# Patient Record
Sex: Female | Born: 1965 | Race: White | Hispanic: No | Marital: Married | Smoking: Never smoker
Health system: Southern US, Community
[De-identification: ages and names within clinical notes are randomized; demographics above are authoritative.]

## PROBLEM LIST (undated history)

## (undated) HISTORY — PX: EYE SURGERY: SHX253

---

## 2006-11-24 ENCOUNTER — Other Ambulatory Visit: Admission: RE | Admit: 2006-11-24 | Discharge: 2006-11-24 | Payer: Self-pay | Admitting: Family Medicine

## 2016-05-31 DIAGNOSIS — M5431 Sciatica, right side: Secondary | ICD-10-CM | POA: Diagnosis not present

## 2016-05-31 DIAGNOSIS — M9903 Segmental and somatic dysfunction of lumbar region: Secondary | ICD-10-CM | POA: Diagnosis not present

## 2016-06-02 DIAGNOSIS — M9903 Segmental and somatic dysfunction of lumbar region: Secondary | ICD-10-CM | POA: Diagnosis not present

## 2016-06-02 DIAGNOSIS — M5431 Sciatica, right side: Secondary | ICD-10-CM | POA: Diagnosis not present

## 2016-06-08 DIAGNOSIS — M5431 Sciatica, right side: Secondary | ICD-10-CM | POA: Diagnosis not present

## 2016-06-08 DIAGNOSIS — M9903 Segmental and somatic dysfunction of lumbar region: Secondary | ICD-10-CM | POA: Diagnosis not present

## 2016-06-09 DIAGNOSIS — M5431 Sciatica, right side: Secondary | ICD-10-CM | POA: Diagnosis not present

## 2016-06-09 DIAGNOSIS — M9903 Segmental and somatic dysfunction of lumbar region: Secondary | ICD-10-CM | POA: Diagnosis not present

## 2016-06-14 DIAGNOSIS — M9903 Segmental and somatic dysfunction of lumbar region: Secondary | ICD-10-CM | POA: Diagnosis not present

## 2016-06-14 DIAGNOSIS — M5431 Sciatica, right side: Secondary | ICD-10-CM | POA: Diagnosis not present

## 2016-06-16 DIAGNOSIS — M9903 Segmental and somatic dysfunction of lumbar region: Secondary | ICD-10-CM | POA: Diagnosis not present

## 2016-06-16 DIAGNOSIS — M5431 Sciatica, right side: Secondary | ICD-10-CM | POA: Diagnosis not present

## 2016-06-21 DIAGNOSIS — M9903 Segmental and somatic dysfunction of lumbar region: Secondary | ICD-10-CM | POA: Diagnosis not present

## 2016-06-21 DIAGNOSIS — M5431 Sciatica, right side: Secondary | ICD-10-CM | POA: Diagnosis not present

## 2016-06-23 DIAGNOSIS — M9903 Segmental and somatic dysfunction of lumbar region: Secondary | ICD-10-CM | POA: Diagnosis not present

## 2016-06-23 DIAGNOSIS — M5431 Sciatica, right side: Secondary | ICD-10-CM | POA: Diagnosis not present

## 2016-06-28 DIAGNOSIS — M9903 Segmental and somatic dysfunction of lumbar region: Secondary | ICD-10-CM | POA: Diagnosis not present

## 2016-06-28 DIAGNOSIS — M5431 Sciatica, right side: Secondary | ICD-10-CM | POA: Diagnosis not present

## 2016-06-30 DIAGNOSIS — M9903 Segmental and somatic dysfunction of lumbar region: Secondary | ICD-10-CM | POA: Diagnosis not present

## 2016-06-30 DIAGNOSIS — M5431 Sciatica, right side: Secondary | ICD-10-CM | POA: Diagnosis not present

## 2016-07-06 DIAGNOSIS — M5431 Sciatica, right side: Secondary | ICD-10-CM | POA: Diagnosis not present

## 2016-07-06 DIAGNOSIS — M9903 Segmental and somatic dysfunction of lumbar region: Secondary | ICD-10-CM | POA: Diagnosis not present

## 2016-07-12 DIAGNOSIS — M9903 Segmental and somatic dysfunction of lumbar region: Secondary | ICD-10-CM | POA: Diagnosis not present

## 2016-07-12 DIAGNOSIS — M5431 Sciatica, right side: Secondary | ICD-10-CM | POA: Diagnosis not present

## 2016-07-26 DIAGNOSIS — M5431 Sciatica, right side: Secondary | ICD-10-CM | POA: Diagnosis not present

## 2016-07-26 DIAGNOSIS — M9903 Segmental and somatic dysfunction of lumbar region: Secondary | ICD-10-CM | POA: Diagnosis not present

## 2016-08-09 DIAGNOSIS — M5431 Sciatica, right side: Secondary | ICD-10-CM | POA: Diagnosis not present

## 2016-08-09 DIAGNOSIS — M9903 Segmental and somatic dysfunction of lumbar region: Secondary | ICD-10-CM | POA: Diagnosis not present

## 2016-09-13 DIAGNOSIS — M9903 Segmental and somatic dysfunction of lumbar region: Secondary | ICD-10-CM | POA: Diagnosis not present

## 2016-09-13 DIAGNOSIS — M5431 Sciatica, right side: Secondary | ICD-10-CM | POA: Diagnosis not present

## 2016-09-26 DIAGNOSIS — M5431 Sciatica, right side: Secondary | ICD-10-CM | POA: Diagnosis not present

## 2016-09-26 DIAGNOSIS — M9903 Segmental and somatic dysfunction of lumbar region: Secondary | ICD-10-CM | POA: Diagnosis not present

## 2016-10-18 DIAGNOSIS — M5431 Sciatica, right side: Secondary | ICD-10-CM | POA: Diagnosis not present

## 2016-10-18 DIAGNOSIS — M9903 Segmental and somatic dysfunction of lumbar region: Secondary | ICD-10-CM | POA: Diagnosis not present

## 2016-10-28 DIAGNOSIS — Z13220 Encounter for screening for lipoid disorders: Secondary | ICD-10-CM | POA: Diagnosis not present

## 2016-10-28 DIAGNOSIS — Z124 Encounter for screening for malignant neoplasm of cervix: Secondary | ICD-10-CM | POA: Diagnosis not present

## 2016-10-28 DIAGNOSIS — Z131 Encounter for screening for diabetes mellitus: Secondary | ICD-10-CM | POA: Diagnosis not present

## 2016-10-28 DIAGNOSIS — Z0000 Encounter for general adult medical examination without abnormal findings: Secondary | ICD-10-CM | POA: Diagnosis not present

## 2016-11-22 DIAGNOSIS — M9903 Segmental and somatic dysfunction of lumbar region: Secondary | ICD-10-CM | POA: Diagnosis not present

## 2016-11-22 DIAGNOSIS — M5431 Sciatica, right side: Secondary | ICD-10-CM | POA: Diagnosis not present

## 2016-12-27 DIAGNOSIS — M5431 Sciatica, right side: Secondary | ICD-10-CM | POA: Diagnosis not present

## 2016-12-27 DIAGNOSIS — M9903 Segmental and somatic dysfunction of lumbar region: Secondary | ICD-10-CM | POA: Diagnosis not present

## 2017-01-24 DIAGNOSIS — M9903 Segmental and somatic dysfunction of lumbar region: Secondary | ICD-10-CM | POA: Diagnosis not present

## 2017-01-24 DIAGNOSIS — M5431 Sciatica, right side: Secondary | ICD-10-CM | POA: Diagnosis not present

## 2017-04-09 ENCOUNTER — Emergency Department (HOSPITAL_COMMUNITY): Payer: Federal, State, Local not specified - PPO

## 2017-04-09 ENCOUNTER — Encounter (HOSPITAL_COMMUNITY): Payer: Self-pay | Admitting: Emergency Medicine

## 2017-04-09 ENCOUNTER — Emergency Department (HOSPITAL_COMMUNITY)
Admission: EM | Admit: 2017-04-09 | Discharge: 2017-04-09 | Disposition: A | Discharge status: Home / Self Care | Payer: Federal, State, Local not specified - PPO | Attending: Emergency Medicine | Admitting: Emergency Medicine

## 2017-04-09 DIAGNOSIS — Z23 Encounter for immunization: Secondary | ICD-10-CM | POA: Insufficient documentation

## 2017-04-09 DIAGNOSIS — W231XXA Caught, crushed, jammed, or pinched between stationary objects, initial encounter: Secondary | ICD-10-CM | POA: Diagnosis not present

## 2017-04-09 DIAGNOSIS — Y999 Unspecified external cause status: Secondary | ICD-10-CM | POA: Diagnosis not present

## 2017-04-09 DIAGNOSIS — S61422A Laceration with foreign body of left hand, initial encounter: Secondary | ICD-10-CM | POA: Insufficient documentation

## 2017-04-09 DIAGNOSIS — Y929 Unspecified place or not applicable: Secondary | ICD-10-CM | POA: Diagnosis not present

## 2017-04-09 DIAGNOSIS — Y93G1 Activity, food preparation and clean up: Secondary | ICD-10-CM | POA: Diagnosis not present

## 2017-04-09 DIAGNOSIS — S60922A Unspecified superficial injury of left hand, initial encounter: Secondary | ICD-10-CM | POA: Diagnosis present

## 2017-04-09 MED ORDER — CEPHALEXIN 500 MG PO CAPS: 500.0000 mg | ORAL_CAPSULE | Freq: Four times a day (QID) | ORAL | 0 refills | Status: AC

## 2017-04-09 MED ORDER — BACITRACIN ZINC 500 UNIT/GM EX OINT
TOPICAL_OINTMENT | Freq: Two times a day (BID) | CUTANEOUS | Status: DC
  Administered 2017-04-09: 1 via TOPICAL
  Filled 2017-04-09: qty 0.9

## 2017-04-09 MED ORDER — LIDOCAINE HCL (PF) 1 % IJ SOLN
10.0000 mL | Freq: Once | INTRAMUSCULAR | Status: AC
  Administered 2017-04-09: 10 mL
  Filled 2017-04-09: qty 10

## 2017-04-09 MED ORDER — TETANUS-DIPHTH-ACELL PERTUSSIS 5-2.5-18.5 LF-MCG/0.5 IM SUSP
0.5000 mL | Freq: Once | INTRAMUSCULAR | Status: AC
  Administered 2017-04-09: 0.5 mL via INTRAMUSCULAR
  Filled 2017-04-09: qty 0.5

## 2017-04-09 MED ORDER — CEPHALEXIN 250 MG PO CAPS
500.0000 mg | ORAL_CAPSULE | Freq: Once | ORAL | Status: AC
  Administered 2017-04-09: 500 mg via ORAL
  Filled 2017-04-09: qty 2

## 2017-04-09 NOTE — Discharge Instructions (Signed)
Follow up with your doctor in 7 to 10 days for suture removal. Return here sooner for any signs of infection.

## 2017-04-09 NOTE — ED Provider Notes (Signed)
[facility]EMERGENCY DEPT Provider Note    By signing my name below, I, Christina Rosario, attest that this documentation has been prepared under the direction and in the presence of [facility], [place]. Electronically Signed: Earmon Rosario, ED Scribe. 04/09/17. 10:50 PM.    History   Chief Complaint Chief Complaint  Patient presents with  . Laceration   The history is provided by the patient and medical records. No language interpreter was used.    Christina Rosario is a 51 y.o. female who presents to the Emergency Department complaining of a laceration to the left hand that occurred approximately 1.5 hours ago. She reports associated bleeding that has been uncontrolled at this time. She states she hit her head on the kennel. She states she was feeding her son's dog when he ran off causing the leash to lacerate the dorsum of the hand. She has not taken anything for pain. There are no modifying factors noted. She denies fever, chills, nausea, vomiting, numbness, tingling or weakness of the left hand or fingers, neck pain, LOC. She states her tetanus vaccination was between 5-10 years ago but she is unsure.  History reviewed. No pertinent past medical history.  There are no active problems to display for this patient.   Past Surgical History:  Procedure Laterality Date  . EYE SURGERY      OB History    No data available       Home Medications    Prior to Admission medications   Medication Sig Start Date End Date Taking? Authorizing Provider  cephALEXin (KEFLEX) 500 MG capsule Take 1 capsule (500 mg total) by mouth 4 (four) times daily. 04/09/17   Janne Napoleon, NP    Family History No family history on file.  Social History Social History  Substance Use Topics  . Smoking status: Never Smoker  . Smokeless tobacco: Never Used  . Alcohol use Yes     Comment: occ     Allergies   Patient has no known allergies.   Review of Systems Review of Systems  Constitutional:  Negative for chills and fever.  Gastrointestinal: Negative for nausea and vomiting.  Skin: Positive for wound.  Neurological: Negative for syncope, weakness and numbness.     Physical Exam Updated Vital Signs BP 119/89 (BP Location: Right Arm)   Pulse 97   Temp 98.1 F (36.7 C) (Oral)   Resp 16   Ht 5' 4.5" (1.638 m)   Wt 136 lb 2 oz (61.7 kg)   SpO2 99%   BMI 23.00 kg/m   Physical Exam  Constitutional: She is oriented to person, place, and time. She appears well-developed and well-nourished. No distress.  HENT:  Head: Normocephalic and atraumatic.  Eyes: EOM are normal.  Neck: Neck supple.  Cardiovascular: Normal rate.   Pulmonary/Chest: Effort normal.  Musculoskeletal: Normal range of motion. She exhibits tenderness. She exhibits no deformity.  6.5 cm laceration to the dorsum of the left hand at base of fingers. Full ROM of left hand. Full flexion and extension of fingers without difficulty.  Neurological: She is alert and oriented to person, place, and time. No cranial nerve deficit.  Skin: Skin is warm and dry.  Psychiatric: She has a normal mood and affect.  Nursing note and vitals reviewed.    ED Treatments / Results  DIAGNOSTIC STUDIES: Oxygen Saturation is 99% on RA, normal by my interpretation.   COORDINATION OF CARE: 9:19 PM- Will order imaging and tetanus vaccination. Offered pain medication but patient  declined. Will suture wound after imaging results. Pt verbalizes understanding and agrees to plan.  Medications  bacitracin ointment (1 application Topical Given 04/09/17 2311)  Tdap (BOOSTRIX) injection 0.5 mL (0.5 mLs Intramuscular Given 04/09/17 2312)  lidocaine (PF) (XYLOCAINE) 1 % injection 10 mL (10 mLs Infiltration Given 04/09/17 2130)  cephALEXin (KEFLEX) capsule 500 mg (500 mg Oral Given 04/09/17 2311)    Labs (all labs ordered are listed, but only abnormal results are displayed) Labs Reviewed - No data to display  EKG  EKG Interpretation None         Radiology Dg Hand Complete Left  Result Date: 04/09/2017 CLINICAL DATA:  Laceration from dog leash, initial encounter EXAM: LEFT HAND - COMPLETE 3+ VIEW COMPARISON:  None. FINDINGS: Soft tissue defect is noted posteriorly consistent with the given clinical history. No acute bony abnormality is seen. IMPRESSION: Soft tissue injury without bony abnormality. Electronically Signed   By: Alcide CleverMark  Lukens M.D.   On: 04/09/2017 22:05    Procedures .Marland Kitchen.Laceration Repair Date/Time: 04/09/2017 10:12 PM Performed by: Janne NapoleonNEESE, Georganne Siple M Authorized by: Janne NapoleonNEESE, Khara Renaud M   Consent:    Consent obtained:  Verbal   Consent given by:  Patient   Risks discussed:  Infection, pain and poor cosmetic result Anesthesia (see MAR for exact dosages):    Anesthesia method:  Local infiltration   Local anesthetic:  Lidocaine 1% w/o epi Laceration details:    Location:  Hand   Hand location:  L hand, dorsum   Length (cm):  6.5 Repair type:    Repair type:  Simple Pre-procedure details:    Preparation:  Patient was prepped and draped in usual sterile fashion and imaging obtained to evaluate for foreign bodies Exploration:    Hemostasis achieved with:  Direct pressure   Wound exploration: entire depth of wound probed and visualized     Wound extent: no muscle damage noted, no nerve damage noted, no tendon damage noted and no underlying fracture noted     Contaminated: no   Treatment:    Area cleansed with:  Betadine and saline   Amount of cleaning:  Extensive   Irrigation solution:  Sterile saline   Irrigation method:  Syringe   Visualized foreign bodies/material removed: yes (small pieces of dirt)   Skin repair:    Repair method:  Sutures   Suture size:  5-0   Suture material:  Prolene   Suture technique:  Simple interrupted   Number of sutures:  9 Approximation:    Approximation:  Close   Vermilion border: well-aligned   Post-procedure details:    Dressing:  Sterile dressing and antibiotic ointment   Patient  tolerance of procedure:  Tolerated well, no immediate complications Comments:     Tetanus updated       Medications Ordered in ED Medications  bacitracin ointment (1 application Topical Given 04/09/17 2311)  Tdap (BOOSTRIX) injection 0.5 mL (0.5 mLs Intramuscular Given 04/09/17 2312)  lidocaine (PF) (XYLOCAINE) 1 % injection 10 mL (10 mLs Infiltration Given 04/09/17 2130)  cephALEXin (KEFLEX) capsule 500 mg (500 mg Oral Given 04/09/17 2311)     Initial Impression / Assessment and Plan / ED Course  I have reviewed the triage vital signs and the nursing notes.  Tetanus updated in ED. Laceration occurred approximately 2 hours prior to repair. Discussed laceration care with pt and answered questions. Pt to f-u for suture removal in 7 to 10 days and wound check sooner should there be signs of dehiscence or infection. Pt  is hemodynamically stable with no complaints prior to dc.  Will start Keflex due to the contamination of the wound.   Final Clinical Impressions(s) / ED Diagnoses   Final diagnoses:  Laceration of left hand with foreign body, initial encounter    New Prescriptions Discharge Medication List as of 04/09/2017 10:54 PM    START taking these medications   Details  cephALEXin (KEFLEX) 500 MG capsule Take 1 capsule (500 mg total) by mouth 4 (four) times daily., Starting Sun 04/09/2017, Print       I personally performed the services described in this documentation, which was scribed in my presence. The recorded information has been reviewed and is accurate.     Kerrie Buffalo [place], [place] 04/10/17 0136    Gerhard Munch, MD 04/12/17 1230

## 2017-04-09 NOTE — ED Notes (Signed)
ED Provider at bedside. 

## 2017-04-09 NOTE — ED Notes (Signed)
Pt reports she had the dog runner wrapped around her hand when he pulled away she went forward, fell, and hit her head.

## 2017-04-09 NOTE — ED Triage Notes (Signed)
Pt states while attempting to stop dog from running lead was wrapped around L hand, dog pulled patient causing laceration to dorsal left hand and struck top of head on enclosure, denies LOC. Bleeding to hand controlled.

## 2017-04-14 DIAGNOSIS — S61412A Laceration without foreign body of left hand, initial encounter: Secondary | ICD-10-CM | POA: Diagnosis not present

## 2017-04-18 DIAGNOSIS — S61412D Laceration without foreign body of left hand, subsequent encounter: Secondary | ICD-10-CM | POA: Diagnosis not present

## 2017-06-09 DIAGNOSIS — J029 Acute pharyngitis, unspecified: Secondary | ICD-10-CM | POA: Diagnosis not present

## 2017-06-09 DIAGNOSIS — J069 Acute upper respiratory infection, unspecified: Secondary | ICD-10-CM | POA: Diagnosis not present

## 2017-07-25 DIAGNOSIS — M5431 Sciatica, right side: Secondary | ICD-10-CM | POA: Diagnosis not present

## 2017-07-25 DIAGNOSIS — M9903 Segmental and somatic dysfunction of lumbar region: Secondary | ICD-10-CM | POA: Diagnosis not present

## 2017-11-24 IMAGING — DX DG HAND COMPLETE 3+V*L*
3 series · 3 of 3 positions shown · non-contrast
Comparison: None.

CLINICAL DATA: Laceration from dog leash, initial encounter

EXAM:
LEFT HAND - COMPLETE 3+ VIEW

[x hand pa left]
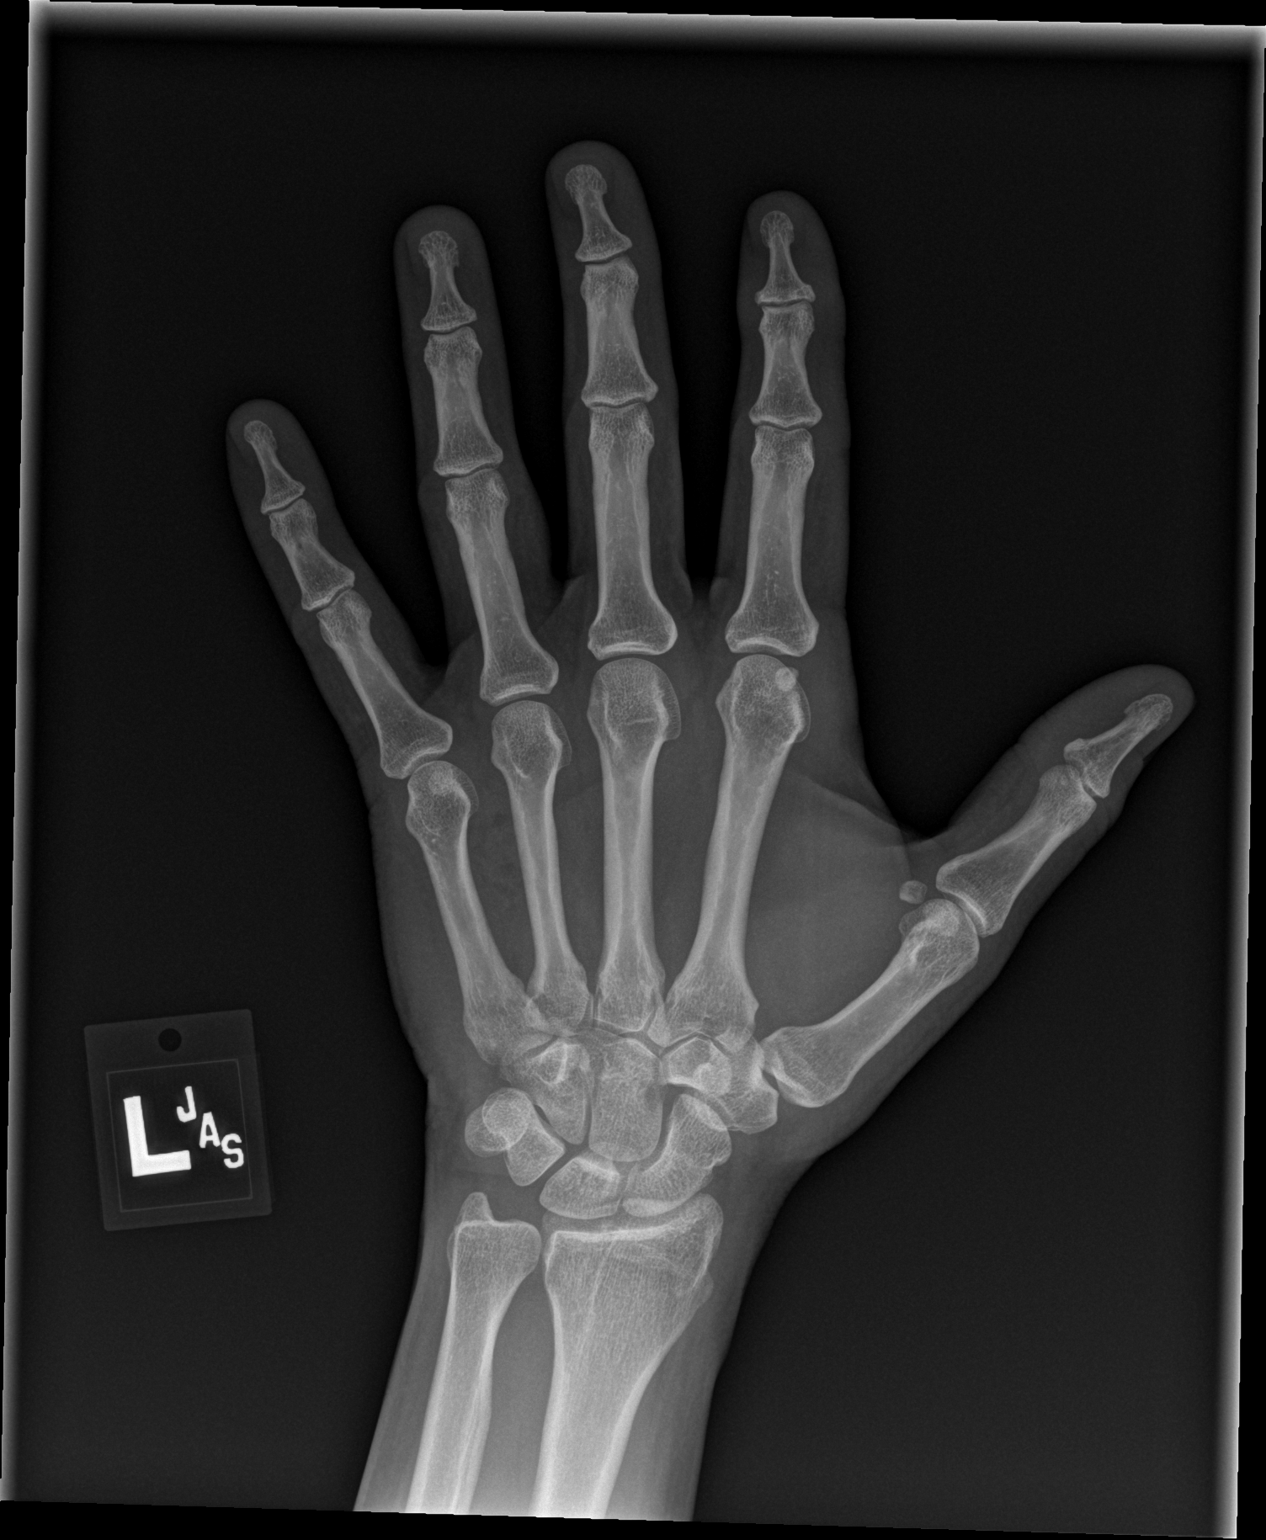

[x hand obl left]
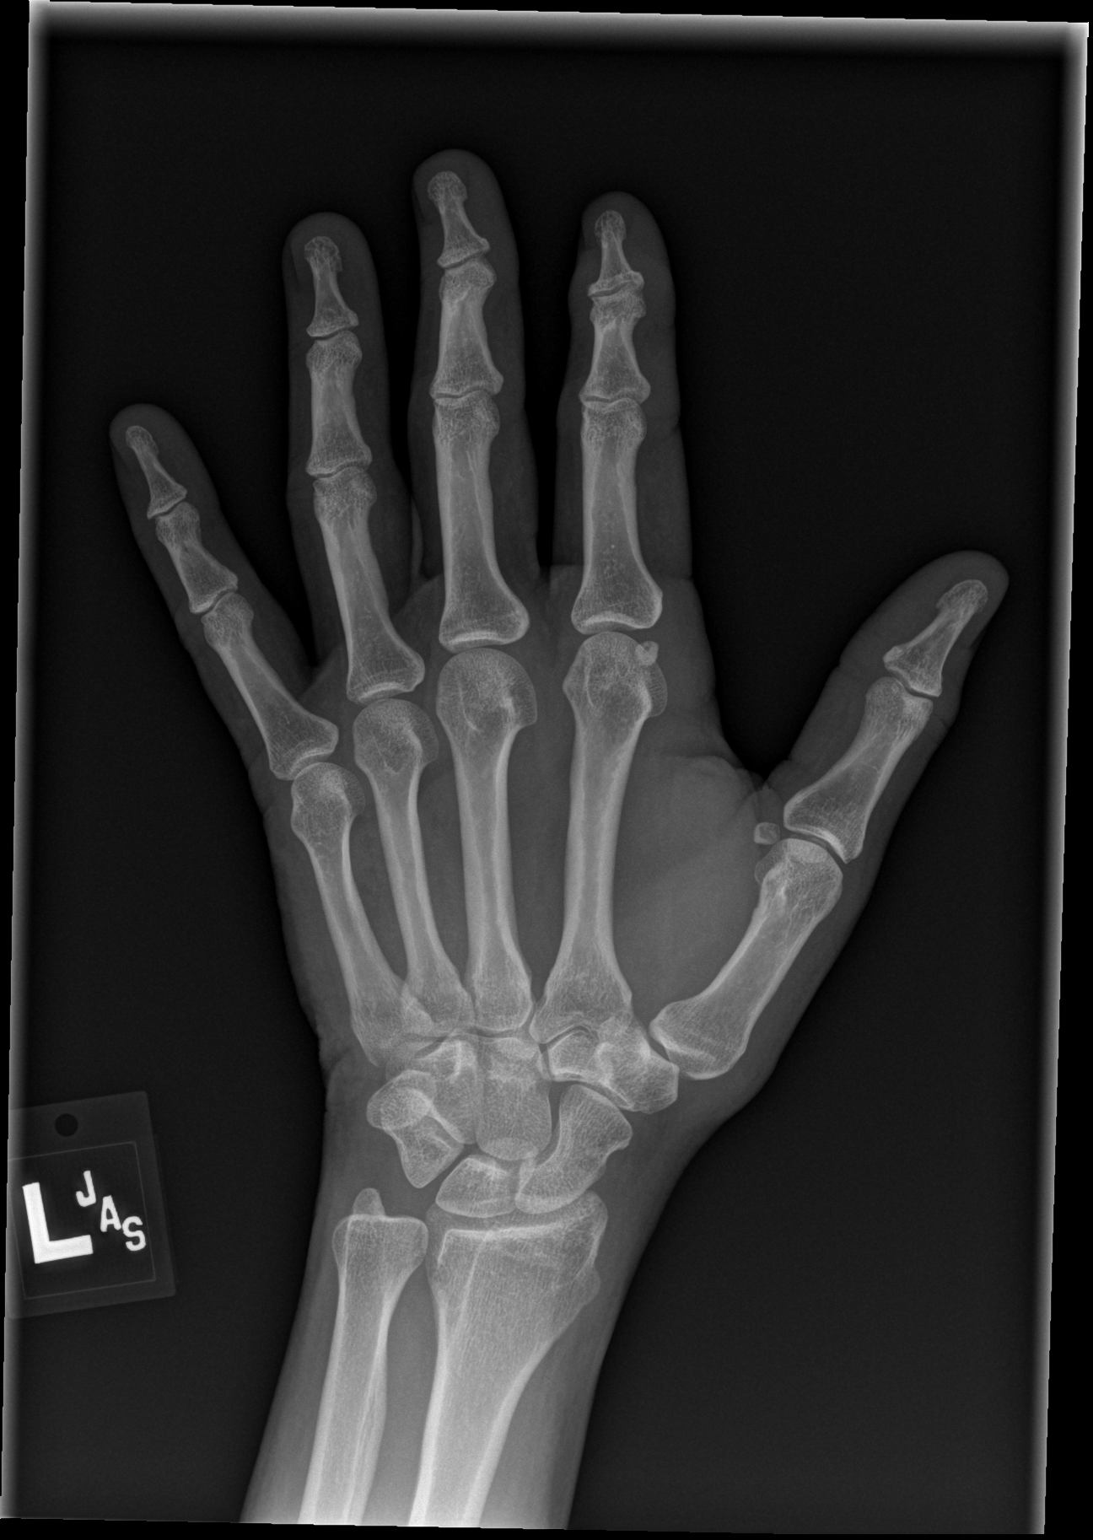

[x hand lat left]
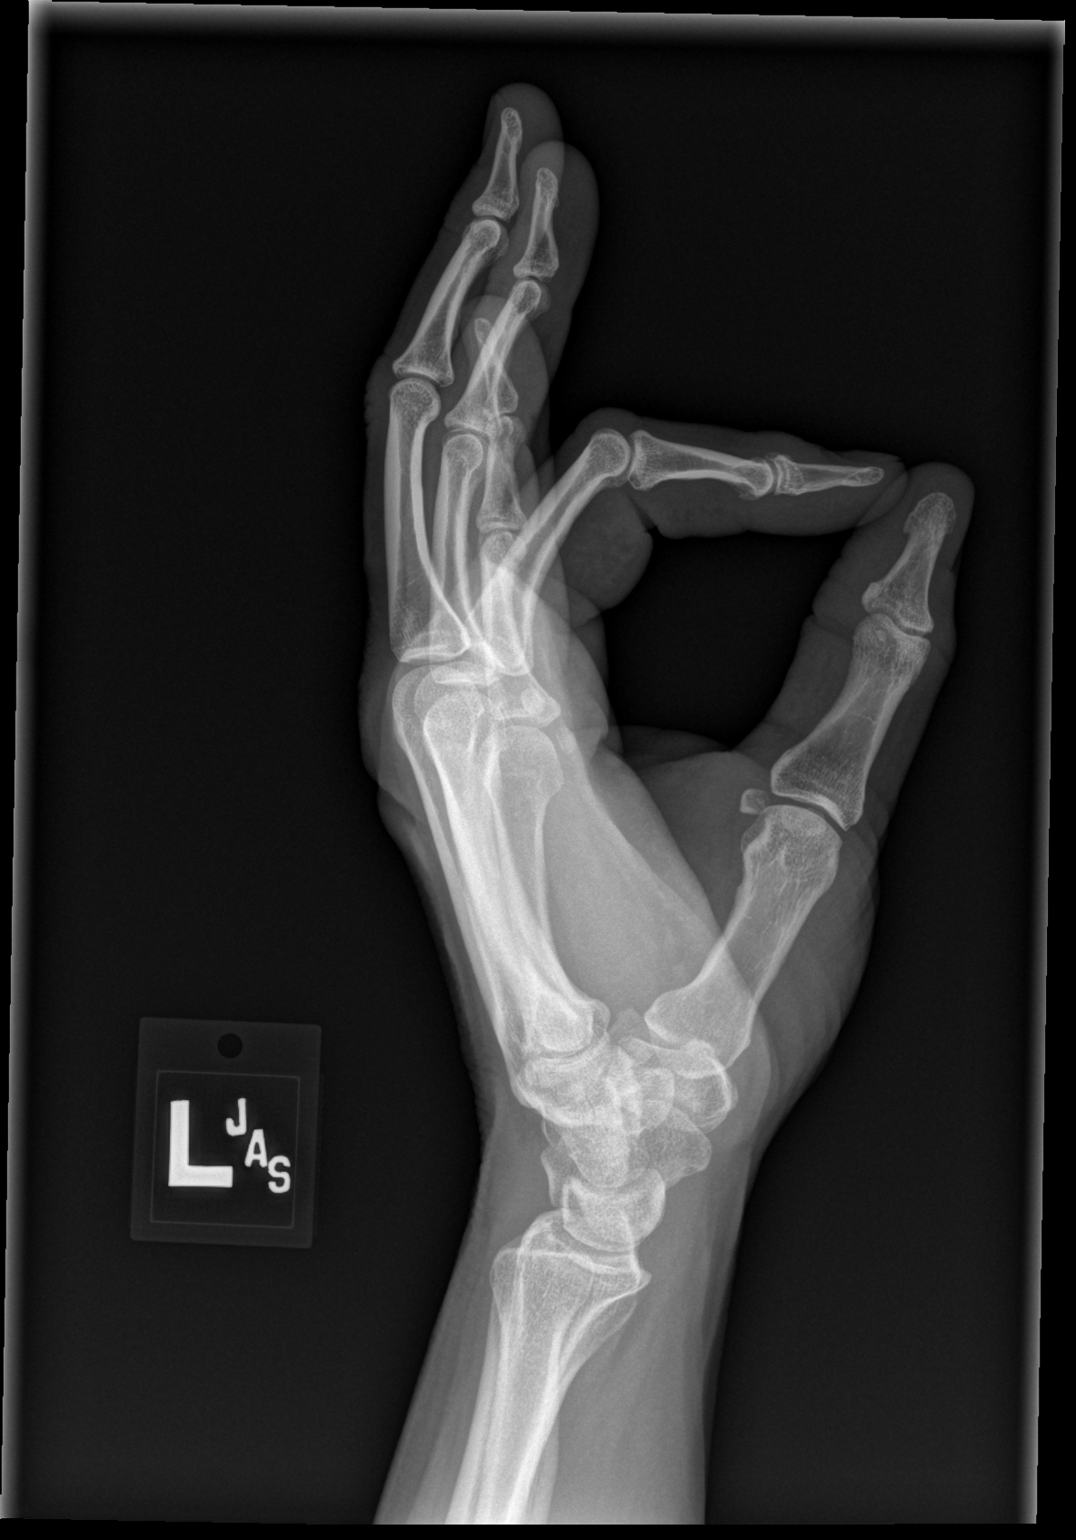

[3 of 3 positions shown; findings below may reference images not displayed]

FINDINGS: Soft tissue defect is noted posteriorly consistent with the given
clinical history. No acute bony abnormality is seen.
IMPRESSION: Soft tissue injury without bony abnormality.

## 2018-04-18 DIAGNOSIS — M9903 Segmental and somatic dysfunction of lumbar region: Secondary | ICD-10-CM | POA: Diagnosis not present

## 2018-04-18 DIAGNOSIS — M5431 Sciatica, right side: Secondary | ICD-10-CM | POA: Diagnosis not present

## 2018-06-28 DIAGNOSIS — Z124 Encounter for screening for malignant neoplasm of cervix: Secondary | ICD-10-CM | POA: Diagnosis not present

## 2018-06-28 DIAGNOSIS — Z01419 Encounter for gynecological examination (general) (routine) without abnormal findings: Secondary | ICD-10-CM | POA: Diagnosis not present

## 2018-07-26 DIAGNOSIS — D260 Other benign neoplasm of cervix uteri: Secondary | ICD-10-CM | POA: Diagnosis not present

## 2018-07-26 DIAGNOSIS — N941 Unspecified dyspareunia: Secondary | ICD-10-CM | POA: Diagnosis not present

## 2018-07-26 DIAGNOSIS — N841 Polyp of cervix uteri: Secondary | ICD-10-CM | POA: Diagnosis not present

## 2018-10-25 DIAGNOSIS — N952 Postmenopausal atrophic vaginitis: Secondary | ICD-10-CM | POA: Diagnosis not present

## 2018-10-25 DIAGNOSIS — N941 Unspecified dyspareunia: Secondary | ICD-10-CM | POA: Diagnosis not present

## 2018-12-06 ENCOUNTER — Other Ambulatory Visit: Payer: Self-pay | Admitting: Obstetrics & Gynecology

## 2018-12-06 DIAGNOSIS — Z1231 Encounter for screening mammogram for malignant neoplasm of breast: Secondary | ICD-10-CM

## 2018-12-11 DIAGNOSIS — M5431 Sciatica, right side: Secondary | ICD-10-CM | POA: Diagnosis not present

## 2018-12-11 DIAGNOSIS — M9903 Segmental and somatic dysfunction of lumbar region: Secondary | ICD-10-CM | POA: Diagnosis not present

## 2018-12-13 DIAGNOSIS — M9903 Segmental and somatic dysfunction of lumbar region: Secondary | ICD-10-CM | POA: Diagnosis not present

## 2018-12-13 DIAGNOSIS — M5431 Sciatica, right side: Secondary | ICD-10-CM | POA: Diagnosis not present

## 2018-12-18 DIAGNOSIS — M5431 Sciatica, right side: Secondary | ICD-10-CM | POA: Diagnosis not present

## 2018-12-18 DIAGNOSIS — M9903 Segmental and somatic dysfunction of lumbar region: Secondary | ICD-10-CM | POA: Diagnosis not present

## 2019-01-03 ENCOUNTER — Ambulatory Visit
Admission: RE | Admit: 2019-01-03 | Discharge: 2019-01-03 | Disposition: A | Discharge status: Home / Self Care | Payer: Federal, State, Local not specified - PPO | Source: Ambulatory Visit | Attending: Obstetrics & Gynecology | Admitting: Obstetrics & Gynecology

## 2019-01-03 DIAGNOSIS — Z1231 Encounter for screening mammogram for malignant neoplasm of breast: Secondary | ICD-10-CM

## 2019-08-15 DIAGNOSIS — M5431 Sciatica, right side: Secondary | ICD-10-CM | POA: Diagnosis not present

## 2019-08-15 DIAGNOSIS — M9903 Segmental and somatic dysfunction of lumbar region: Secondary | ICD-10-CM | POA: Diagnosis not present

## 2019-12-04 DIAGNOSIS — Z0000 Encounter for general adult medical examination without abnormal findings: Secondary | ICD-10-CM | POA: Diagnosis not present

## 2019-12-04 DIAGNOSIS — M5431 Sciatica, right side: Secondary | ICD-10-CM | POA: Diagnosis not present

## 2019-12-04 DIAGNOSIS — M9903 Segmental and somatic dysfunction of lumbar region: Secondary | ICD-10-CM | POA: Diagnosis not present

## 2019-12-05 DIAGNOSIS — M5431 Sciatica, right side: Secondary | ICD-10-CM | POA: Diagnosis not present

## 2019-12-05 DIAGNOSIS — M9903 Segmental and somatic dysfunction of lumbar region: Secondary | ICD-10-CM | POA: Diagnosis not present

## 2019-12-11 DIAGNOSIS — M9903 Segmental and somatic dysfunction of lumbar region: Secondary | ICD-10-CM | POA: Diagnosis not present

## 2019-12-11 DIAGNOSIS — M5431 Sciatica, right side: Secondary | ICD-10-CM | POA: Diagnosis not present

## 2020-01-01 DIAGNOSIS — M9903 Segmental and somatic dysfunction of lumbar region: Secondary | ICD-10-CM | POA: Diagnosis not present

## 2020-01-01 DIAGNOSIS — M5431 Sciatica, right side: Secondary | ICD-10-CM | POA: Diagnosis not present

## 2020-01-29 DIAGNOSIS — M9903 Segmental and somatic dysfunction of lumbar region: Secondary | ICD-10-CM | POA: Diagnosis not present

## 2020-01-29 DIAGNOSIS — M5431 Sciatica, right side: Secondary | ICD-10-CM | POA: Diagnosis not present

## 2020-03-04 DIAGNOSIS — M9903 Segmental and somatic dysfunction of lumbar region: Secondary | ICD-10-CM | POA: Diagnosis not present

## 2020-03-04 DIAGNOSIS — M5431 Sciatica, right side: Secondary | ICD-10-CM | POA: Diagnosis not present

## 2020-04-08 DIAGNOSIS — M9903 Segmental and somatic dysfunction of lumbar region: Secondary | ICD-10-CM | POA: Diagnosis not present

## 2020-04-08 DIAGNOSIS — M5431 Sciatica, right side: Secondary | ICD-10-CM | POA: Diagnosis not present

## 2020-05-15 DIAGNOSIS — T07XXXA Unspecified multiple injuries, initial encounter: Secondary | ICD-10-CM | POA: Diagnosis not present

## 2020-06-10 DIAGNOSIS — M5431 Sciatica, right side: Secondary | ICD-10-CM | POA: Diagnosis not present

## 2020-06-10 DIAGNOSIS — M9903 Segmental and somatic dysfunction of lumbar region: Secondary | ICD-10-CM | POA: Diagnosis not present

## 2020-06-24 DIAGNOSIS — M9903 Segmental and somatic dysfunction of lumbar region: Secondary | ICD-10-CM | POA: Diagnosis not present

## 2020-06-24 DIAGNOSIS — M5431 Sciatica, right side: Secondary | ICD-10-CM | POA: Diagnosis not present

## 2020-07-06 DIAGNOSIS — Z20828 Contact with and (suspected) exposure to other viral communicable diseases: Secondary | ICD-10-CM | POA: Diagnosis not present

## 2020-07-06 DIAGNOSIS — R05 Cough: Secondary | ICD-10-CM | POA: Diagnosis not present

## 2020-07-07 DIAGNOSIS — R05 Cough: Secondary | ICD-10-CM | POA: Diagnosis not present

## 2020-07-07 DIAGNOSIS — Z20828 Contact with and (suspected) exposure to other viral communicable diseases: Secondary | ICD-10-CM | POA: Diagnosis not present

## 2020-07-20 DIAGNOSIS — Z20828 Contact with and (suspected) exposure to other viral communicable diseases: Secondary | ICD-10-CM | POA: Diagnosis not present

## 2020-07-22 DIAGNOSIS — M9903 Segmental and somatic dysfunction of lumbar region: Secondary | ICD-10-CM | POA: Diagnosis not present

## 2020-07-22 DIAGNOSIS — M5431 Sciatica, right side: Secondary | ICD-10-CM | POA: Diagnosis not present

## 2020-07-30 DIAGNOSIS — M9903 Segmental and somatic dysfunction of lumbar region: Secondary | ICD-10-CM | POA: Diagnosis not present

## 2020-07-30 DIAGNOSIS — M5431 Sciatica, right side: Secondary | ICD-10-CM | POA: Diagnosis not present

## 2020-08-20 DIAGNOSIS — M9903 Segmental and somatic dysfunction of lumbar region: Secondary | ICD-10-CM | POA: Diagnosis not present

## 2020-08-20 DIAGNOSIS — M5431 Sciatica, right side: Secondary | ICD-10-CM | POA: Diagnosis not present

## 2020-09-01 DIAGNOSIS — M5431 Sciatica, right side: Secondary | ICD-10-CM | POA: Diagnosis not present

## 2020-09-01 DIAGNOSIS — M9903 Segmental and somatic dysfunction of lumbar region: Secondary | ICD-10-CM | POA: Diagnosis not present

## 2020-09-30 DIAGNOSIS — M5431 Sciatica, right side: Secondary | ICD-10-CM | POA: Diagnosis not present

## 2020-09-30 DIAGNOSIS — M9903 Segmental and somatic dysfunction of lumbar region: Secondary | ICD-10-CM | POA: Diagnosis not present

## 2020-11-05 DIAGNOSIS — M5431 Sciatica, right side: Secondary | ICD-10-CM | POA: Diagnosis not present

## 2020-11-05 DIAGNOSIS — M9903 Segmental and somatic dysfunction of lumbar region: Secondary | ICD-10-CM | POA: Diagnosis not present

## 2020-11-10 DIAGNOSIS — M5431 Sciatica, right side: Secondary | ICD-10-CM | POA: Diagnosis not present

## 2020-11-10 DIAGNOSIS — M9903 Segmental and somatic dysfunction of lumbar region: Secondary | ICD-10-CM | POA: Diagnosis not present

## 2020-11-17 DIAGNOSIS — Z1152 Encounter for screening for COVID-19: Secondary | ICD-10-CM | POA: Diagnosis not present

## 2020-12-07 DIAGNOSIS — M5431 Sciatica, right side: Secondary | ICD-10-CM | POA: Diagnosis not present

## 2020-12-07 DIAGNOSIS — M9903 Segmental and somatic dysfunction of lumbar region: Secondary | ICD-10-CM | POA: Diagnosis not present

## 2020-12-14 DIAGNOSIS — M5431 Sciatica, right side: Secondary | ICD-10-CM | POA: Diagnosis not present

## 2020-12-14 DIAGNOSIS — M9903 Segmental and somatic dysfunction of lumbar region: Secondary | ICD-10-CM | POA: Diagnosis not present

## 2020-12-17 DIAGNOSIS — M5431 Sciatica, right side: Secondary | ICD-10-CM | POA: Diagnosis not present

## 2020-12-17 DIAGNOSIS — M9903 Segmental and somatic dysfunction of lumbar region: Secondary | ICD-10-CM | POA: Diagnosis not present

## 2020-12-24 DIAGNOSIS — M9903 Segmental and somatic dysfunction of lumbar region: Secondary | ICD-10-CM | POA: Diagnosis not present

## 2020-12-24 DIAGNOSIS — M5431 Sciatica, right side: Secondary | ICD-10-CM | POA: Diagnosis not present

## 2021-01-07 DIAGNOSIS — M9903 Segmental and somatic dysfunction of lumbar region: Secondary | ICD-10-CM | POA: Diagnosis not present

## 2021-01-07 DIAGNOSIS — M5431 Sciatica, right side: Secondary | ICD-10-CM | POA: Diagnosis not present

## 2021-01-28 DIAGNOSIS — M9903 Segmental and somatic dysfunction of lumbar region: Secondary | ICD-10-CM | POA: Diagnosis not present

## 2021-01-28 DIAGNOSIS — M5431 Sciatica, right side: Secondary | ICD-10-CM | POA: Diagnosis not present

## 2021-04-07 DIAGNOSIS — M9903 Segmental and somatic dysfunction of lumbar region: Secondary | ICD-10-CM | POA: Diagnosis not present

## 2021-04-07 DIAGNOSIS — M5431 Sciatica, right side: Secondary | ICD-10-CM | POA: Diagnosis not present

## 2021-04-20 DIAGNOSIS — M9903 Segmental and somatic dysfunction of lumbar region: Secondary | ICD-10-CM | POA: Diagnosis not present

## 2021-04-20 DIAGNOSIS — M5431 Sciatica, right side: Secondary | ICD-10-CM | POA: Diagnosis not present

## 2021-05-04 DIAGNOSIS — M5431 Sciatica, right side: Secondary | ICD-10-CM | POA: Diagnosis not present

## 2021-05-04 DIAGNOSIS — M9903 Segmental and somatic dysfunction of lumbar region: Secondary | ICD-10-CM | POA: Diagnosis not present

## 2021-05-27 DIAGNOSIS — M5431 Sciatica, right side: Secondary | ICD-10-CM | POA: Diagnosis not present

## 2021-05-27 DIAGNOSIS — M9903 Segmental and somatic dysfunction of lumbar region: Secondary | ICD-10-CM | POA: Diagnosis not present

## 2021-05-31 DIAGNOSIS — M5431 Sciatica, right side: Secondary | ICD-10-CM | POA: Diagnosis not present

## 2021-05-31 DIAGNOSIS — M9903 Segmental and somatic dysfunction of lumbar region: Secondary | ICD-10-CM | POA: Diagnosis not present

## 2021-06-03 DIAGNOSIS — M5431 Sciatica, right side: Secondary | ICD-10-CM | POA: Diagnosis not present

## 2021-06-03 DIAGNOSIS — M9903 Segmental and somatic dysfunction of lumbar region: Secondary | ICD-10-CM | POA: Diagnosis not present

## 2021-06-07 DIAGNOSIS — M5431 Sciatica, right side: Secondary | ICD-10-CM | POA: Diagnosis not present

## 2021-06-07 DIAGNOSIS — M9903 Segmental and somatic dysfunction of lumbar region: Secondary | ICD-10-CM | POA: Diagnosis not present

## 2021-06-10 DIAGNOSIS — M9903 Segmental and somatic dysfunction of lumbar region: Secondary | ICD-10-CM | POA: Diagnosis not present

## 2021-06-10 DIAGNOSIS — M5431 Sciatica, right side: Secondary | ICD-10-CM | POA: Diagnosis not present

## 2021-06-16 DIAGNOSIS — I10 Essential (primary) hypertension: Secondary | ICD-10-CM | POA: Diagnosis not present

## 2021-06-16 DIAGNOSIS — N814 Uterovaginal prolapse, unspecified: Secondary | ICD-10-CM | POA: Diagnosis not present

## 2021-06-16 DIAGNOSIS — Z124 Encounter for screening for malignant neoplasm of cervix: Secondary | ICD-10-CM | POA: Diagnosis not present

## 2021-06-17 DIAGNOSIS — M5431 Sciatica, right side: Secondary | ICD-10-CM | POA: Diagnosis not present

## 2021-06-17 DIAGNOSIS — M9903 Segmental and somatic dysfunction of lumbar region: Secondary | ICD-10-CM | POA: Diagnosis not present

## 2021-06-24 DIAGNOSIS — M5431 Sciatica, right side: Secondary | ICD-10-CM | POA: Diagnosis not present

## 2021-06-24 DIAGNOSIS — M9903 Segmental and somatic dysfunction of lumbar region: Secondary | ICD-10-CM | POA: Diagnosis not present

## 2021-07-05 DIAGNOSIS — M5431 Sciatica, right side: Secondary | ICD-10-CM | POA: Diagnosis not present

## 2021-07-05 DIAGNOSIS — M9903 Segmental and somatic dysfunction of lumbar region: Secondary | ICD-10-CM | POA: Diagnosis not present

## 2021-07-08 DIAGNOSIS — M9903 Segmental and somatic dysfunction of lumbar region: Secondary | ICD-10-CM | POA: Diagnosis not present

## 2021-07-08 DIAGNOSIS — M5431 Sciatica, right side: Secondary | ICD-10-CM | POA: Diagnosis not present

## 2021-07-13 DIAGNOSIS — N8110 Cystocele, unspecified: Secondary | ICD-10-CM | POA: Diagnosis not present

## 2021-07-13 DIAGNOSIS — N841 Polyp of cervix uteri: Secondary | ICD-10-CM | POA: Diagnosis not present

## 2021-07-13 DIAGNOSIS — N816 Rectocele: Secondary | ICD-10-CM | POA: Diagnosis not present

## 2021-07-13 DIAGNOSIS — N814 Uterovaginal prolapse, unspecified: Secondary | ICD-10-CM | POA: Diagnosis not present

## 2021-07-15 DIAGNOSIS — M5431 Sciatica, right side: Secondary | ICD-10-CM | POA: Diagnosis not present

## 2021-07-15 DIAGNOSIS — M9903 Segmental and somatic dysfunction of lumbar region: Secondary | ICD-10-CM | POA: Diagnosis not present

## 2021-07-29 DIAGNOSIS — M9903 Segmental and somatic dysfunction of lumbar region: Secondary | ICD-10-CM | POA: Diagnosis not present

## 2021-07-29 DIAGNOSIS — M5431 Sciatica, right side: Secondary | ICD-10-CM | POA: Diagnosis not present

## 2021-08-09 DIAGNOSIS — Z111 Encounter for screening for respiratory tuberculosis: Secondary | ICD-10-CM | POA: Diagnosis not present

## 2021-08-16 DIAGNOSIS — M9903 Segmental and somatic dysfunction of lumbar region: Secondary | ICD-10-CM | POA: Diagnosis not present

## 2021-08-16 DIAGNOSIS — M5431 Sciatica, right side: Secondary | ICD-10-CM | POA: Diagnosis not present

## 2021-09-07 DIAGNOSIS — M9903 Segmental and somatic dysfunction of lumbar region: Secondary | ICD-10-CM | POA: Diagnosis not present

## 2021-09-07 DIAGNOSIS — M5431 Sciatica, right side: Secondary | ICD-10-CM | POA: Diagnosis not present

## 2021-09-23 DIAGNOSIS — M9903 Segmental and somatic dysfunction of lumbar region: Secondary | ICD-10-CM | POA: Diagnosis not present

## 2021-09-23 DIAGNOSIS — M5431 Sciatica, right side: Secondary | ICD-10-CM | POA: Diagnosis not present

## 2021-10-07 DIAGNOSIS — Z23 Encounter for immunization: Secondary | ICD-10-CM | POA: Diagnosis not present

## 2021-10-07 DIAGNOSIS — S41102S Unspecified open wound of left upper arm, sequela: Secondary | ICD-10-CM | POA: Diagnosis not present

## 2021-10-28 DIAGNOSIS — N814 Uterovaginal prolapse, unspecified: Secondary | ICD-10-CM | POA: Diagnosis not present

## 2021-10-28 DIAGNOSIS — R39198 Other difficulties with micturition: Secondary | ICD-10-CM | POA: Diagnosis not present

## 2021-10-28 DIAGNOSIS — N905 Atrophy of vulva: Secondary | ICD-10-CM | POA: Diagnosis not present

## 2021-10-28 DIAGNOSIS — N398 Other specified disorders of urinary system: Secondary | ICD-10-CM | POA: Diagnosis not present

## 2021-12-09 DIAGNOSIS — N393 Stress incontinence (female) (male): Secondary | ICD-10-CM | POA: Diagnosis not present

## 2021-12-20 DIAGNOSIS — B9689 Other specified bacterial agents as the cause of diseases classified elsewhere: Secondary | ICD-10-CM | POA: Diagnosis not present

## 2021-12-20 DIAGNOSIS — J019 Acute sinusitis, unspecified: Secondary | ICD-10-CM | POA: Diagnosis not present

## 2021-12-23 DIAGNOSIS — N812 Incomplete uterovaginal prolapse: Secondary | ICD-10-CM | POA: Diagnosis not present

## 2021-12-23 DIAGNOSIS — N393 Stress incontinence (female) (male): Secondary | ICD-10-CM | POA: Diagnosis not present

## 2021-12-28 DIAGNOSIS — N813 Complete uterovaginal prolapse: Secondary | ICD-10-CM | POA: Diagnosis not present

## 2021-12-28 DIAGNOSIS — N393 Stress incontinence (female) (male): Secondary | ICD-10-CM | POA: Diagnosis not present

## 2021-12-28 DIAGNOSIS — N838 Other noninflammatory disorders of ovary, fallopian tube and broad ligament: Secondary | ICD-10-CM | POA: Diagnosis not present

## 2021-12-28 DIAGNOSIS — N83312 Acquired atrophy of left ovary: Secondary | ICD-10-CM | POA: Diagnosis not present

## 2021-12-28 DIAGNOSIS — D267 Other benign neoplasm of other parts of uterus: Secondary | ICD-10-CM | POA: Diagnosis not present

## 2021-12-28 DIAGNOSIS — N83311 Acquired atrophy of right ovary: Secondary | ICD-10-CM | POA: Diagnosis not present

## 2021-12-28 DIAGNOSIS — N814 Uterovaginal prolapse, unspecified: Secondary | ICD-10-CM | POA: Diagnosis not present

## 2021-12-28 DIAGNOSIS — N816 Rectocele: Secondary | ICD-10-CM | POA: Diagnosis not present

## 2021-12-29 DIAGNOSIS — N813 Complete uterovaginal prolapse: Secondary | ICD-10-CM | POA: Diagnosis not present

## 2021-12-29 DIAGNOSIS — N393 Stress incontinence (female) (male): Secondary | ICD-10-CM | POA: Diagnosis not present

## 2022-02-03 DIAGNOSIS — N812 Incomplete uterovaginal prolapse: Secondary | ICD-10-CM | POA: Diagnosis not present

## 2022-02-03 DIAGNOSIS — N393 Stress incontinence (female) (male): Secondary | ICD-10-CM | POA: Diagnosis not present

## 2022-08-31 ENCOUNTER — Ambulatory Visit (HOSPITAL_BASED_OUTPATIENT_CLINIC_OR_DEPARTMENT_OTHER)
Admission: RE | Admit: 2022-08-31 | Discharge: 2022-08-31 | Disposition: A | Discharge status: Home / Self Care | Payer: Federal, State, Local not specified - PPO | Source: Ambulatory Visit | Attending: Family Medicine | Admitting: Family Medicine

## 2022-08-31 ENCOUNTER — Other Ambulatory Visit (HOSPITAL_BASED_OUTPATIENT_CLINIC_OR_DEPARTMENT_OTHER): Payer: Self-pay | Admitting: Family Medicine

## 2022-08-31 DIAGNOSIS — J069 Acute upper respiratory infection, unspecified: Secondary | ICD-10-CM | POA: Insufficient documentation

## 2022-09-13 DIAGNOSIS — J069 Acute upper respiratory infection, unspecified: Secondary | ICD-10-CM | POA: Diagnosis not present

## 2022-12-29 DIAGNOSIS — Z006 Encounter for examination for normal comparison and control in clinical research program: Secondary | ICD-10-CM | POA: Diagnosis not present

## 2022-12-29 DIAGNOSIS — N952 Postmenopausal atrophic vaginitis: Secondary | ICD-10-CM | POA: Diagnosis not present

## 2022-12-29 DIAGNOSIS — N814 Uterovaginal prolapse, unspecified: Secondary | ICD-10-CM | POA: Diagnosis not present

## 2023-03-30 DIAGNOSIS — H903 Sensorineural hearing loss, bilateral: Secondary | ICD-10-CM | POA: Diagnosis not present

## 2023-09-22 DIAGNOSIS — J018 Other acute sinusitis: Secondary | ICD-10-CM | POA: Diagnosis not present

## 2023-09-27 DIAGNOSIS — J069 Acute upper respiratory infection, unspecified: Secondary | ICD-10-CM | POA: Diagnosis not present

## 2023-11-13 DIAGNOSIS — M9903 Segmental and somatic dysfunction of lumbar region: Secondary | ICD-10-CM | POA: Diagnosis not present

## 2023-11-13 DIAGNOSIS — M5432 Sciatica, left side: Secondary | ICD-10-CM | POA: Diagnosis not present

## 2023-11-16 DIAGNOSIS — M9903 Segmental and somatic dysfunction of lumbar region: Secondary | ICD-10-CM | POA: Diagnosis not present

## 2023-11-16 DIAGNOSIS — M5432 Sciatica, left side: Secondary | ICD-10-CM | POA: Diagnosis not present

## 2023-11-20 DIAGNOSIS — M9903 Segmental and somatic dysfunction of lumbar region: Secondary | ICD-10-CM | POA: Diagnosis not present

## 2023-11-20 DIAGNOSIS — M5432 Sciatica, left side: Secondary | ICD-10-CM | POA: Diagnosis not present

## 2023-11-23 DIAGNOSIS — M9903 Segmental and somatic dysfunction of lumbar region: Secondary | ICD-10-CM | POA: Diagnosis not present

## 2023-11-23 DIAGNOSIS — M5432 Sciatica, left side: Secondary | ICD-10-CM | POA: Diagnosis not present

## 2023-12-14 DIAGNOSIS — J069 Acute upper respiratory infection, unspecified: Secondary | ICD-10-CM | POA: Diagnosis not present

## 2023-12-19 DIAGNOSIS — J019 Acute sinusitis, unspecified: Secondary | ICD-10-CM | POA: Diagnosis not present

## 2023-12-19 DIAGNOSIS — J310 Chronic rhinitis: Secondary | ICD-10-CM | POA: Diagnosis not present

## 2023-12-19 DIAGNOSIS — J22 Unspecified acute lower respiratory infection: Secondary | ICD-10-CM | POA: Diagnosis not present

## 2024-01-22 DIAGNOSIS — M5432 Sciatica, left side: Secondary | ICD-10-CM | POA: Diagnosis not present

## 2024-01-22 DIAGNOSIS — M9903 Segmental and somatic dysfunction of lumbar region: Secondary | ICD-10-CM | POA: Diagnosis not present

## 2024-07-08 ENCOUNTER — Emergency Department (HOSPITAL_BASED_OUTPATIENT_CLINIC_OR_DEPARTMENT_OTHER)
Admission: EM | Admit: 2024-07-08 | Discharge: 2024-07-08 | Disposition: A | Discharge status: Home / Self Care | Attending: Emergency Medicine | Admitting: Emergency Medicine

## 2024-07-08 ENCOUNTER — Encounter (HOSPITAL_BASED_OUTPATIENT_CLINIC_OR_DEPARTMENT_OTHER): Payer: Self-pay

## 2024-07-08 ENCOUNTER — Other Ambulatory Visit: Payer: Self-pay

## 2024-07-08 ENCOUNTER — Emergency Department (HOSPITAL_BASED_OUTPATIENT_CLINIC_OR_DEPARTMENT_OTHER)

## 2024-07-08 DIAGNOSIS — R519 Headache, unspecified: Secondary | ICD-10-CM | POA: Diagnosis not present

## 2024-07-08 DIAGNOSIS — I10 Essential (primary) hypertension: Secondary | ICD-10-CM | POA: Insufficient documentation

## 2024-07-08 DIAGNOSIS — R030 Elevated blood-pressure reading, without diagnosis of hypertension: Secondary | ICD-10-CM

## 2024-07-08 DIAGNOSIS — R42 Dizziness and giddiness: Secondary | ICD-10-CM

## 2024-07-08 DIAGNOSIS — J069 Acute upper respiratory infection, unspecified: Secondary | ICD-10-CM | POA: Insufficient documentation

## 2024-07-08 DIAGNOSIS — R0981 Nasal congestion: Secondary | ICD-10-CM

## 2024-07-08 DIAGNOSIS — R0989 Other specified symptoms and signs involving the circulatory and respiratory systems: Secondary | ICD-10-CM

## 2024-07-08 LAB — CBC
HCT: 43.7 % (ref 36.0–46.0)
Hemoglobin: 15.6 g/dL — ABNORMAL HIGH (ref 12.0–15.0)
MCH: 29.8 pg (ref 26.0–34.0)
MCHC: 35.7 g/dL (ref 30.0–36.0)
MCV: 83.4 fL (ref 80.0–100.0)
Platelets: 224 K/uL (ref 150–400)
RBC: 5.24 MIL/uL — ABNORMAL HIGH (ref 3.87–5.11)
RDW: 13.7 % (ref 11.5–15.5)
WBC: 9.6 K/uL (ref 4.0–10.5)
nRBC: 0 % (ref 0.0–0.2)

## 2024-07-08 LAB — URINALYSIS, ROUTINE W REFLEX MICROSCOPIC
Bilirubin Urine: NEGATIVE
Glucose, UA: NEGATIVE mg/dL
Hgb urine dipstick: NEGATIVE
Ketones, ur: NEGATIVE mg/dL
Leukocytes,Ua: NEGATIVE
Nitrite: NEGATIVE
Protein, ur: NEGATIVE mg/dL
Specific Gravity, Urine: 1.01 (ref 1.005–1.030)
pH: 7.5 (ref 5.0–8.0)

## 2024-07-08 LAB — COMPREHENSIVE METABOLIC PANEL WITH GFR
ALT: 24 U/L (ref 0–44)
AST: 27 U/L (ref 15–41)
Albumin: 5.2 g/dL — ABNORMAL HIGH (ref 3.5–5.0)
Alkaline Phosphatase: 90 U/L (ref 38–126)
Anion gap: 15 (ref 5–15)
BUN: 14 mg/dL (ref 6–20)
CO2: 26 mmol/L (ref 22–32)
Calcium: 10.2 mg/dL (ref 8.9–10.3)
Chloride: 101 mmol/L (ref 98–111)
Creatinine, Ser: 0.93 mg/dL (ref 0.44–1.00)
GFR, Estimated: >60 mL/min (ref >60)
Glucose, Bld: 97 mg/dL (ref 70–99)
Potassium: 3.7 mmol/L (ref 3.5–5.1)
Sodium: 141 mmol/L (ref 135–145)
Total Bilirubin: 0.4 mg/dL (ref 0.0–1.2)
Total Protein: 8.2 g/dL — ABNORMAL HIGH (ref 6.5–8.1)

## 2024-07-08 MED ORDER — AMLODIPINE BESYLATE 2.5 MG PO TABS: 2.5000 mg | ORAL_TABLET | Freq: Every day | ORAL | 0 refills | Status: DC

## 2024-07-08 MED ORDER — MECLIZINE HCL 12.5 MG PO TABS: 12.5000 mg | ORAL_TABLET | Freq: Three times a day (TID) | ORAL | 0 refills | Status: DC | PRN

## 2024-07-08 MED ORDER — MECLIZINE HCL 25 MG PO TABS
12.5000 mg | ORAL_TABLET | Freq: Once | ORAL | Status: AC
  Administered 2024-07-08: 12.5 mg via ORAL
  Filled 2024-07-08: qty 1

## 2024-07-08 MED ORDER — ACETAMINOPHEN 500 MG PO TABS
1000.0000 mg | ORAL_TABLET | Freq: Once | ORAL | Status: DC
  Filled 2024-07-08: qty 2

## 2024-07-08 MED ORDER — AMLODIPINE BESYLATE 5 MG PO TABS
2.5000 mg | ORAL_TABLET | Freq: Once | ORAL | Status: AC
  Administered 2024-07-08: 2.5 mg via ORAL
  Filled 2024-07-08: qty 1

## 2024-07-08 NOTE — ED Notes (Signed)
 Reviewed AVS/discharge instruction with patient. Time allotted for and all questions answered. Patient is agreeable for d/c and escorted to ed exit by staff.

## 2024-07-08 NOTE — ED Notes (Signed)
 Ambulatory to restroom

## 2024-07-08 NOTE — ED Provider Notes (Addendum)
 [facility] Provider Note   CSN: [id number] Arrival date & time: 07/08/24  1839     Patient presents with: Dizziness and Hypertension   Christina Rosario is a 58 y.o. female.   Pt c/o dizziness in past two weeks. Indicates worse in morning, will feel a room spinning, unsteady sensation, at times worse w head movements. Also notes around that time some nasal and upper respiratory congestion, runny nose, and sinus pressure. Intermittent mild headaches, no acute, abrupt or severe head or sinus pain. No neck stiffness/pain. States for long time has had eustachian tube dysfunction and intermittently feeling fluid in ears. No acute ear pain, tinnitus or hearing loss. Denies any associated unilateral numbness or weakness. No change in speech or vision. No falls, has remained ambulatory. No syncope. No fever or chills. Denies chest pain or discomfort. No sob or unusual doe.   The history is provided by the patient, medical records and the spouse.  Dizziness Associated symptoms: no chest pain, no diarrhea, no shortness of breath, no vomiting and no weakness   Hypertension Pertinent negatives include no chest pain, no abdominal pain and no shortness of breath.       Prior to Admission medications   Medication Sig Start Date End Date Taking? Authorizing Provider  cephALEXin  (KEFLEX ) 500 MG capsule Take 1 capsule (500 mg total) by mouth 4 (four) times daily. 04/09/17   Jamelle Lorrayne HERO, NP    Allergies: Patient has no known allergies.    Review of Systems  Constitutional:  Negative for chills and fever.  HENT:  Positive for congestion and rhinorrhea. Negative for sore throat and trouble swallowing.   Eyes:  Negative for pain, redness and visual disturbance.  Respiratory:  Negative for cough and shortness of breath.   Cardiovascular:  Negative for chest pain and leg swelling.  Gastrointestinal:  Negative for abdominal pain, diarrhea and vomiting.   Genitourinary:  Negative for dysuria and flank pain.  Musculoskeletal:  Negative for back pain and neck pain.  Neurological:  Positive for dizziness. Negative for syncope, speech difficulty, weakness and numbness.  Psychiatric/Behavioral:  Negative for confusion.     Updated Vital Signs BP (!) 180/101   Pulse 81   Temp 98.2 F (36.8 C)   Resp 18   SpO2 100%   Physical Exam Vitals and nursing note reviewed.  Constitutional:      Appearance: Normal appearance. She is well-developed.  HENT:     Head: Atraumatic.     Comments: No sinus, mastoid, or temporal tenderness.     Right Ear: Tympanic membrane, ear canal and external ear normal.     Left Ear: Tympanic membrane, ear canal and external ear normal.     Ears:     Comments: Some clear fluid behind tms.     Nose: Congestion present.     Mouth/Throat:     Mouth: Mucous membranes are moist.     Pharynx: Oropharynx is clear.  Eyes:     General: No scleral icterus.    Extraocular Movements: Extraocular movements intact.     Conjunctiva/sclera: Conjunctivae normal.     Pupils: Pupils are equal, round, and reactive to light.  Neck:     Vascular: No carotid bruit.     Trachea: No tracheal deviation.     Comments: Trachea midline, thyroid not grossly enlarged or tender. No neck stiffness or rigidity.  Cardiovascular:     Rate and Rhythm: Normal rate and regular rhythm.  Pulses: Normal pulses.     Heart sounds: Normal heart sounds. No murmur heard.    No friction rub. No gallop.  Pulmonary:     Effort: Pulmonary effort is normal. No respiratory distress.     Breath sounds: Normal breath sounds.  Abdominal:     General: Bowel sounds are normal. There is no distension.     Palpations: Abdomen is soft.     Tenderness: There is no abdominal tenderness.  Genitourinary:    Comments: No cva tenderness.  Musculoskeletal:        General: No swelling or tenderness.     Cervical back: Normal range of motion and neck supple. No  rigidity. No muscular tenderness.     Right lower leg: No edema.     Left lower leg: No edema.  Skin:    General: Skin is warm and dry.     Findings: No rash.  Neurological:     General: No focal deficit present.     Mental Status: She is alert.     Comments: Alert, speech normal, no dysarthria or aphasia. Motor/sens grossly intact bil, stre 5/5 bil. No pronator drift. Finger to nose wnl bil. Ambulates w steady gait, no ataxia. No visual field cut/deficit.   Psychiatric:        Mood and Affect: Mood normal.     (all labs ordered are listed, but only abnormal results are displayed) Results for orders placed or performed during the hospital encounter of 07/08/24  Comprehensive metabolic panel   Collection Time: 07/08/24  6:48 PM  Result Value Ref Range   Sodium 141 135 - 145 mmol/L   Potassium 3.7 3.5 - 5.1 mmol/L   Chloride 101 98 - 111 mmol/L   CO2 26 22 - 32 mmol/L   Glucose, Bld 97 70 - 99 mg/dL   BUN 14 6 - 20 mg/dL   Creatinine, Ser 9.06 0.44 - 1.00 mg/dL   Calcium 89.7 8.9 - 89.6 mg/dL   Total Protein 8.2 (H) 6.5 - 8.1 g/dL   Albumin 5.2 (H) 3.5 - 5.0 g/dL   AST 27 15 - 41 U/L   ALT 24 0 - 44 U/L   Alkaline Phosphatase 90 38 - 126 U/L   Total Bilirubin 0.4 0.0 - 1.2 mg/dL   GFR, Estimated >39 >39 mL/min   Anion gap 15 5 - 15  CBC   Collection Time: 07/08/24  6:48 PM  Result Value Ref Range   WBC 9.6 4.0 - 10.5 K/uL   RBC 5.24 (H) 3.87 - 5.11 MIL/uL   Hemoglobin 15.6 (H) 12.0 - 15.0 g/dL   HCT 56.2 63.9 - 53.9 %   MCV 83.4 80.0 - 100.0 fL   MCH 29.8 26.0 - 34.0 pg   MCHC 35.7 30.0 - 36.0 g/dL   RDW 86.2 88.4 - 84.4 %   Platelets 224 150 - 400 K/uL   nRBC 0.0 0.0 - 0.2 %  Urinalysis, Routine w reflex microscopic -Urine, Clean Catch   Collection Time: 07/08/24  6:48 PM  Result Value Ref Range   Color, Urine YELLOW YELLOW   APPearance CLOUDY (A) CLEAR   Specific Gravity, Urine 1.010 1.005 - 1.030   pH 7.5 5.0 - 8.0   Glucose, UA NEGATIVE NEGATIVE mg/dL   Hgb  urine dipstick NEGATIVE NEGATIVE   Bilirubin Urine NEGATIVE NEGATIVE   Ketones, ur NEGATIVE NEGATIVE mg/dL   Protein, ur NEGATIVE NEGATIVE mg/dL   Nitrite NEGATIVE NEGATIVE   Leukocytes,Ua NEGATIVE NEGATIVE  RBC / HPF 0-5 0 - 5 RBC/hpf   WBC, UA 6-10 0 - 5 WBC/hpf   Bacteria, UA RARE (A) NONE SEEN   Squamous Epithelial / HPF 11-20 0 - 5 /HPF   Crystals PRESENT (A) NEGATIVE    EKG: EKG Interpretation Date/Time:  Monday July 08 2024 18:49:44 EDT Ventricular Rate:  80 PR Interval:  136 QRS Duration:  82 QT Interval:  374 QTC Calculation: 431 R Axis:   -5  Text Interpretation: Normal sinus rhythm Normal ECG No previous ECGs available Confirmed by Bernard Drivers ([id number]) on 07/08/2024 9:05:01 PM  Radiology: CT Head Wo Contrast Result Date: 07/08/2024 CLINICAL DATA:  Headache, increasing frequency or severity dizziness headache EXAM: CT HEAD WITHOUT CONTRAST TECHNIQUE: Contiguous axial images were obtained from the base of the skull through the vertex without intravenous contrast. RADIATION DOSE REDUCTION: This exam was performed according to the departmental dose-optimization program which includes automated exposure control, adjustment of the mA and/or kV according to patient size and/or use of iterative reconstruction technique. COMPARISON:  None Available. FINDINGS: Brain: No evidence of acute infarction, hemorrhage, hydrocephalus, extra-axial collection or mass lesion/mass effect. Vascular: No hyperdense vessel or unexpected calcification. Skull: Normal. Negative for fracture or focal lesion. Sinuses/Orbits: No acute finding. Other: Mastoid air cells and middle ear cavities are clear. IMPRESSION: 1. No acute intracranial abnormality. Electronically Signed   By: Dorethia Molt M.D.   On: 07/08/2024 20:51     Procedures   Medications Ordered in the ED  meclizine  (ANTIVERT ) tablet 12.5 mg (12.5 mg Oral Given 07/08/24 1937)                                    Medical Decision  Making Problems Addressed: Dizziness: acute illness or injury    Details: Subacute, x 2 weeks.  Elevated blood pressure reading: acute illness or injury Nasal congestion: acute illness or injury    Details: Acute/chronic Symptoms of upper respiratory infection (URI): acute illness or injury with systemic symptoms  Amount and/or Complexity of Data Reviewed Independent Historian: spouse External Data Reviewed: notes. Labs: ordered. Decision-making details documented in ED Course. Radiology: ordered and independent interpretation performed. Decision-making details documented in ED Course. ECG/medicine tests: ordered and independent interpretation performed. Decision-making details documented in ED Course.  Risk OTC drugs. Prescription drug management. Decision regarding hospitalization.   Iv ns. Continuous pulse ox and cardiac monitoring. Labs ordered/sent. Imaging ordered.   Differential diagnosis includes vertigo, viral syndrome, atypical cva, etc. Dispo decision including potential need for admission considered - will get labs and imaging and reassess.   Reviewed nursing notes and prior charts for additional history. External reports reviewed. Additional history from: spouse.   Cardiac monitor: sinus rhythm, rate 74.  Labs reviewed/interpreted by me - wbc and hct normal. Chem unremarkable.  No dysuria or gu c/o.   Antivert  po. Po fluids.  Pt notes hx of hypertension, was on bp meds for a couple years in early 2020s.  Will start low dose bp med.   CT reviewed/interpreted by me - no hem or acute process.   Currently no focal deficits noted on neuro exam, and symptoms present for past two weeks.   Rec zyrtec-d, antivert , low dose bp meds, and close pcp f/u.   Rec close pcp/neurology f/u.  Return precautions provided.       Final diagnoses:  Dizziness  Symptoms of upper respiratory infection (URI)  Elevated blood  pressure reading    ED Discharge Orders     None               Bernard Drivers, MD 07/08/24 2131

## 2024-07-08 NOTE — ED Triage Notes (Signed)
 Patient reports intermittent vertigo for 2 weeks that typically resolved it self. She states the last 3 days the vertigo has not gone away. She also reports headache and some hypertension. She reports taking ibuprofen with minimal relief.

## 2024-07-08 NOTE — Discharge Instructions (Addendum)
 It was our pleasure to provide your ER care today - we hope that you feel better. Drink plenty of fluids/stay well hydrated.  For sinus congestion and/or allergy symptoms, take zyrtec-d or claritin-d as need for symptom relief.   Take antivert  as need if dizzy - no driving when taking and/or if feeling dizzy.   Your blood pressure is high today - take blood pressure medication as prescribed, limite salt intake, and follow up with primary care doctor in the coming week.   For recent dizziness/vertigo, follow up with neurologist in the next  1-2 weeks.   Return to ER if worse, new symptoms, fevers, severe dizziness, change in speech or vision, one-sided numbness/weakness, trouble with balance/gait, chest pain, trouble breathing, or other concern.

## 2024-07-09 DIAGNOSIS — I10 Essential (primary) hypertension: Secondary | ICD-10-CM | POA: Diagnosis not present

## 2024-07-09 DIAGNOSIS — R42 Dizziness and giddiness: Secondary | ICD-10-CM | POA: Diagnosis not present

## 2024-07-22 ENCOUNTER — Telehealth: Payer: Self-pay | Admitting: Neurology

## 2024-07-22 NOTE — Telephone Encounter (Signed)
 Spoke with patient to let her know that we need to reschedule 07/24/24 appt - MD out. Patient will need to check her schedule to see when she can come in. For right now I have a slot held for her with Dr. Gregg for 07/29/24 at 2:45

## 2024-07-22 NOTE — Telephone Encounter (Signed)
 Pt called back and accepted the 9-22 appointment with Dr Gregg

## 2024-07-24 ENCOUNTER — Ambulatory Visit: Admitting: Neurology

## 2024-07-29 ENCOUNTER — Encounter: Payer: Self-pay | Admitting: Neurology

## 2024-07-29 ENCOUNTER — Telehealth: Payer: Self-pay | Admitting: Neurology

## 2024-07-29 ENCOUNTER — Ambulatory Visit: Admitting: Neurology

## 2024-07-29 VITALS — BP 153/88 | HR 81 | Ht 64.0 in | Wt 143.5 lb

## 2024-07-29 DIAGNOSIS — R269 Unspecified abnormalities of gait and mobility: Secondary | ICD-10-CM

## 2024-07-29 DIAGNOSIS — I10 Essential (primary) hypertension: Secondary | ICD-10-CM

## 2024-07-29 DIAGNOSIS — H8110 Benign paroxysmal vertigo, unspecified ear: Secondary | ICD-10-CM | POA: Diagnosis not present

## 2024-07-29 MED ORDER — AMLODIPINE BESYLATE 5 MG PO TABS: 5.0000 mg | ORAL_TABLET | Freq: Every day | ORAL | 0 refills | Status: DC

## 2024-07-29 MED ORDER — MECLIZINE HCL 25 MG PO TABS: 25.0000 mg | ORAL_TABLET | Freq: Three times a day (TID) | ORAL | 0 refills | Status: AC | PRN

## 2024-07-29 NOTE — Patient Instructions (Addendum)
 Referral to vestibular therapy for vertigo and gait abnormality Refill patient meclizine  Will also give him a 30-days supply of amlodipine .  Patient understands to contact PCP for additional refills Return as needed.

## 2024-07-29 NOTE — Telephone Encounter (Signed)
 Referral for physical therapy fax to [facility]. Phone: [phone], Fax: [phone]

## 2024-07-29 NOTE — Progress Notes (Signed)
 [facility]  PATIENT: Christina Rosario DOB: 17-Jul-1966  REQUESTING CLINICIAN: Pa, [facility]* HISTORY FROM: Patient  REASON FOR VISIT: Dizziness    HISTORICAL  CHIEF COMPLAINT:  Chief Complaint  Patient presents with   RM12/DIZZINESS    Pt is here Alone. Pt states that her balance is still off and her head feels like it is swimming. Pt states that she is having nausea some mornings. Pt states that she has to take her time.  Pt states that she wakes up everyday with numbness and tingling,as well as a prickly feeling in her arms.      HISTORY OF PRESENT ILLNESS:  Discussed the use of AI scribe software for clinical note transcription with the patient, who gave verbal consent to proceed.  Christina Rosario is a 58 year old female with history of hypertension who presents with vertigo and dizziness.  She experienced a sudden onset of vertigo over Labor Day weekend, described as a sensation of 'everything spinning' upon waking. This was accompanied by nausea and vomiting, leading to an inability to eat for two days. She sought emergency care where she was administered anti-nausea medication and noted to have elevated blood pressure.  Following the ER visit, her primary care physician noted her blood pressure remained elevated at 140s-150s, despite an initial reading of 181 in the ER. She was previously on lisinopril in 2022 but discontinued it post-surgery due to improved health. Recently, she was prescribed blood pressure medication, Amlodipine  2.5 mg which she took as two tablets in the morning, but the supply ran out five days ago. Her blood pressure readings at home have been normal in the morning but rise to 140 by evening.  The vertigo persists, particularly with movement or rapid head turns, causing brief episodes of room spinning lasting about a minute. 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  She recalls two falls last year, attributed to clumsiness, with no significant injuries. These incidents were characterized by sudden loss of balance, one occurring while reaching forward and another stepping off a garage step. A CT scan performed during her ER visit was clear. She denies having any falls since the onset of her current vertigo, but reports feeling winded when climbing stairs. She has a history of bad balance and worsening over time, but no prior episodes of spinning sensation until recently.  She has been experiencing some morning nausea but has not vomited since the initial episode. She was previously on meclizine  for nausea, which she found effective.  She has a history of hearing issues, using hearing aids, and reports fluid in her ears but no infections. She experiences blocked sensations in her ears, which vary day to day, and has a history of seasonal allergies and mild cat allergies, for which she takes Zyrtec daily.    OTHER MEDICAL CONDITIONS: Hypertension    REVIEW OF SYSTEMS: Full 14 system review of systems performed and negative with exception of: As noted in the HPI   ALLERGIES: No Known Allergies  HOME MEDICATIONS: Outpatient Medications Prior to Visit  Medication Sig Dispense Refill   estradiol (ESTRACE) 0.1 MG/GM vaginal cream With fingertip application, insert pea-sized (0.5 gm/dose) amount into vagina nightly x 2 weeks then every other night     amLODipine  (NORVASC ) 2.5 MG tablet Take 1 tablet (2.5 mg total) by mouth daily. 30 tablet 0   cephALEXin  (KEFLEX )  500 MG capsule Take 1 capsule (500 mg total) by mouth 4 (four) times daily. (Patient not taking: Reported on 07/29/2024) 20 capsule 0   lisinopril (ZESTRIL) 5 MG tablet  (Patient not taking: Reported on 07/29/2024)     meclizine  (ANTIVERT ) 12.5 MG tablet Take 1 tablet (12.5 mg total) by mouth 3 (three) times daily as needed for dizziness. (Patient  not taking: Reported on 07/29/2024) 15 tablet 0   No facility-administered medications prior to visit.    PAST MEDICAL HISTORY: History reviewed. No pertinent past medical history.  PAST SURGICAL HISTORY: Past Surgical History:  Procedure Laterality Date   EYE SURGERY      FAMILY HISTORY: History reviewed. No pertinent family history.  SOCIAL HISTORY: Social History   Socioeconomic History   Marital status: Married    Spouse name: Not on file   Number of children: Not on file   Years of education: Not on file   Highest education level: Not on file  Occupational History   Not on file  Tobacco Use   Smoking status: Never   Smokeless tobacco: Never  Substance and Sexual Activity   Alcohol use: Yes    Comment: occ   Drug use: No   Sexual activity: Not on file  Other Topics Concern   Not on file  Social History Narrative   Not on file   Social Drivers of Health   Financial Resource Strain: Not on file  Food Insecurity: No Food Insecurity (12/28/2021)   Received from [facility] visits prior to 01/07/2023., [facility]   Hunger Vital Sign    Within the past 12 months, you worried that your food would run out before you got the money to buy more.: Never true    Within the past 12 months, the food you bought just didn't last and you didn't have money to get more.: Never true  Transportation Needs: Not on file  Physical Activity: Not on file  Stress: Not on file  Social Connections: Not on file  Intimate Partner Violence: Not on file    PHYSICAL EXAM  GENERAL EXAM/CONSTITUTIONAL: Vitals:  Vitals:   07/29/24 1434 07/29/24 1435 07/29/24 1436 07/29/24 1439  BP:  (!) 154/95 (!) 160/102 (!) 153/88  Pulse:  87 90 81  SpO2:  98% 95% 98%  Weight: 143 lb 8 oz (65.1 kg)     Height: 5' 4 (1.626 m)      Body mass index is 24.63 kg/m. Wt Readings from Last 3 Encounters:  07/29/24 143 lb 8 oz (65.1 kg)  04/09/17 136 lb 2 oz (61.7 kg)   Patient  is in no distress; well developed, nourished and groomed; neck is supple  MUSCULOSKELETAL: Gait, strength, tone, movements noted in Neurologic exam below  NEUROLOGIC: MENTAL STATUS:      No data to display         awake, alert, oriented to person, place and time recent and remote memory intact normal attention and concentration language fluent, comprehension intact, naming intact fund of knowledge appropriate  CRANIAL NERVE:  2nd, 3rd, 4th, 6th - Visual fields full to confrontation, extraocular muscles intact, no nystagmus 5th - facial sensation symmetric 7th - facial strength symmetric 8th - hearing intact 9th - palate elevates symmetrically, uvula midline 11th - shoulder shrug symmetric 12th - tongue protrusion midline  MOTOR:  normal bulk and tone, full strength in the BUE, BLE  SENSORY:  normal and symmetric to light touch  COORDINATION:  finger-nose-finger, fine  finger movements normal  GAIT/STATION:  Wide based gait, unable to tandem    DIAGNOSTIC DATA (LABS, IMAGING, TESTING) - I reviewed patient records, labs, notes, testing and imaging myself where available.  Lab Results  Component Value Date   WBC 9.6 07/08/2024   HGB 15.6 (H) 07/08/2024   HCT 43.7 07/08/2024   MCV 83.4 07/08/2024   PLT 224 07/08/2024      Component Value Date/Time   NA 141 07/08/2024 1848   K 3.7 07/08/2024 1848   CL 101 07/08/2024 1848   CO2 26 07/08/2024 1848   GLUCOSE 97 07/08/2024 1848   BUN 14 07/08/2024 1848   CREATININE 0.93 07/08/2024 1848   CALCIUM 10.2 07/08/2024 1848   PROT 8.2 (H) 07/08/2024 1848   ALBUMIN 5.2 (H) 07/08/2024 1848   AST 27 07/08/2024 1848   ALT 24 07/08/2024 1848   ALKPHOS 90 07/08/2024 1848   BILITOT 0.4 07/08/2024 1848   GFRNONAA >60 07/08/2024 1848   No results found for: CHOL, HDL, LDLCALC, LDLDIRECT, TRIG, CHOLHDL No results found for: YHAJ8R No results found for: VITAMINB12 No results found for: TSH   Head CT  07/08/2024 1. No acute intracranial abnormality.    ASSESSMENT AND PLAN  58 y.o. year old female with    Benign paroxysmal positional vertigo (BPPV) with impaired balance Sudden onset of vertigo on Labor Day weekend with room-spinning sensation, nausea, and vomiting. Symptoms improved with anti-nausea medication. Persistent dizziness with head movement and impaired balance. No prior history of spinning sensation, but balance issues noted over the past year with two falls. CT scan at ER was clear. Symptoms consistent with benign paroxysmal positional vertigo due to displaced otoliths in the vestibular system. Differential includes inner ear issues, stroke, or tumor, but CT scan was clear. - Refer to vestibular therapy, will also ask for balance and gait training - Prescribe meclizine  25 mg as needed for nausea, 45 tablets - Advise on potential initial worsening of symptoms with Epley maneuver - Instruct to call us  if she does not hear from therapy within two weeks  Essential (primary) hypertension Elevated blood pressure, previously on lisinopril but discontinued post-surgery in 2022. Recent episode of high blood pressure during vertigo episode, with readings up to 181 in ER. Blood pressure managed with amlodipine , but medication ran out five days ago. Current readings elevated at 154-160. Primary hypertension likely diagnosis. Discussed side effects of amlodipine , including potential ankle swelling. - Prescribe amlodipine  5 mg daily, 30 tablets - Instruct to monitor blood pressure at home - Advise to contact GP for a 90-day refill - Discuss potential side effects of amlodipine , including ankle swelling    1. Benign paroxysmal positional vertigo, unspecified laterality   2. Gait abnormality   3. Primary hypertension     Patient Instructions  Referral to vestibular therapy for vertigo and gait abnormality Refill patient meclizine  Will also give him a 30-days supply of amlodipine .  Patient  understands to contact PCP for additional refills Return as needed.  Orders Placed This Encounter  Procedures   Ambulatory referral to Physical Therapy    Meds ordered this encounter  Medications   meclizine  (ANTIVERT ) 25 MG tablet    Sig: Take 1 tablet (25 mg total) by mouth 3 (three) times daily as needed for dizziness.    Dispense:  45 tablet    Refill:  0   amLODipine  (NORVASC ) 5 MG tablet    Sig: Take 1 tablet (5 mg total) by mouth daily.    Dispense:  30 tablet    Refill:  0    Return if symptoms worsen or fail to improve.    Pastor Falling, MD 07/29/2024, 3:11 PM  [facility] [address], [address] [place], [place] [address] [phone]

## 2024-08-05 DIAGNOSIS — R269 Unspecified abnormalities of gait and mobility: Secondary | ICD-10-CM | POA: Diagnosis not present

## 2024-08-05 DIAGNOSIS — H8110 Benign paroxysmal vertigo, unspecified ear: Secondary | ICD-10-CM | POA: Diagnosis not present

## 2024-08-14 DIAGNOSIS — R269 Unspecified abnormalities of gait and mobility: Secondary | ICD-10-CM | POA: Diagnosis not present

## 2024-08-14 DIAGNOSIS — H8110 Benign paroxysmal vertigo, unspecified ear: Secondary | ICD-10-CM | POA: Diagnosis not present

## 2024-08-20 ENCOUNTER — Other Ambulatory Visit: Payer: Self-pay | Admitting: Neurology

## 2024-08-21 DIAGNOSIS — H8110 Benign paroxysmal vertigo, unspecified ear: Secondary | ICD-10-CM | POA: Diagnosis not present

## 2024-08-21 DIAGNOSIS — R269 Unspecified abnormalities of gait and mobility: Secondary | ICD-10-CM | POA: Diagnosis not present

## 2024-08-29 DIAGNOSIS — H8110 Benign paroxysmal vertigo, unspecified ear: Secondary | ICD-10-CM | POA: Diagnosis not present

## 2024-08-29 DIAGNOSIS — R269 Unspecified abnormalities of gait and mobility: Secondary | ICD-10-CM | POA: Diagnosis not present

## 2024-09-02 DIAGNOSIS — I10 Essential (primary) hypertension: Secondary | ICD-10-CM | POA: Diagnosis not present

## 2024-09-02 DIAGNOSIS — Z6825 Body mass index (BMI) 25.0-25.9, adult: Secondary | ICD-10-CM | POA: Diagnosis not present

## 2024-10-29 DIAGNOSIS — M9903 Segmental and somatic dysfunction of lumbar region: Secondary | ICD-10-CM | POA: Diagnosis not present

## 2024-10-29 DIAGNOSIS — M5432 Sciatica, left side: Secondary | ICD-10-CM | POA: Diagnosis not present
